# Patient Record
Sex: Male | Born: 1985 | Race: White | Hispanic: No | Marital: Single | Smoking: Never smoker
Health system: Southern US, Community
[De-identification: ages and names within clinical notes are randomized; demographics above are authoritative.]

## PROBLEM LIST (undated history)

## (undated) HISTORY — PX: NASAL FRACTURE SURGERY: SHX718

---
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
## 2014-02-14 ENCOUNTER — Emergency Department (INDEPENDENT_AMBULATORY_CARE_PROVIDER_SITE_OTHER)
Admission: EM | Admit: 2014-02-14 | Discharge: 2014-02-14 | Disposition: A | Discharge status: Home / Self Care | Payer: PRIVATE HEALTH INSURANCE | Source: Home / Self Care | Attending: Emergency Medicine | Admitting: Emergency Medicine

## 2014-02-14 ENCOUNTER — Encounter: Payer: Self-pay | Admitting: *Deleted

## 2014-02-14 DIAGNOSIS — K529 Noninfective gastroenteritis and colitis, unspecified: Secondary | ICD-10-CM

## 2014-02-14 DIAGNOSIS — J029 Acute pharyngitis, unspecified: Secondary | ICD-10-CM

## 2014-02-14 DIAGNOSIS — A09 Infectious gastroenteritis and colitis, unspecified: Secondary | ICD-10-CM

## 2014-02-14 LAB — POCT RAPID STREP A (OFFICE): Rapid Strep A Screen: NEGATIVE

## 2014-02-14 MED ORDER — ONDANSETRON HCL 4 MG PO TABS: 4.0000 mg | ORAL_TABLET | Freq: Four times a day (QID) | ORAL | Status: DC

## 2014-02-14 MED ORDER — CIPROFLOXACIN HCL 500 MG PO TABS: 500.0000 mg | ORAL_TABLET | Freq: Two times a day (BID) | ORAL | Status: DC

## 2014-02-14 MED ORDER — ONDANSETRON 4 MG PO TBDP
4.0000 mg | ORAL_TABLET | Freq: Once | ORAL | Status: AC
  Administered 2014-02-14: 4 mg via ORAL

## 2014-02-14 NOTE — ED Provider Notes (Signed)
CSN: [id number]     Arrival date & time 02/14/14  1001 History   First MD Initiated Contact with Patient 02/14/14 1003     Chief Complaint  Patient presents with  . Emesis  . Sore Throat    Patient is a 28 y.o. male presenting with vomiting.  Emesis Associated symptoms: diarrhea and sore throat   Acute onset this morning of nausea and vomited 4 without any blood. With nausea and mild diffuse abdominal pain without radiation. Had 2 loose watery stools without blood or mucus. His throat feels sore, he feels from irritation from the vomiting.He feels he cannot talk because of the irritation from the vomiting. He communicates his history by typing text responses into his cell phone, then showing the text to nurse and to myself.  No chest pain or shortness of breath. Felt some mild fever and chills. No syncope or focal neurologic symptoms. No rash. No history of tick bite. Has not tried any medication for this. No history of chronic GI problems. He states that he thinks this was triggered by [place] [employer](chorizo), which he bought at a meat market 5 days ago, and self prepared for dinner last night.     History reviewed. No pertinent past medical history. Past Surgical History  Procedure Laterality Date  . Nasal fracture surgery     Family History  Problem Relation Age of Onset  . Hypertension Mother   . Hyperlipidemia Mother    History  Substance Use Topics  . Smoking status: Never Smoker   . Smokeless tobacco: Not on file  . Alcohol Use: No    Review of Systems  HENT: Positive for sore throat.   Cardiovascular: Negative for chest pain, palpitations and leg swelling.  Gastrointestinal: Positive for vomiting and diarrhea.  Genitourinary: Negative.   Neurological: Negative.   All other systems reviewed and are negative.   Allergies  Review of patient's allergies indicates no known allergies.  Home Medications   Prior to Admission medications   Medication Sig Start Date  End Date Taking? Authorizing Provider  ciprofloxacin (CIPRO) 500 MG tablet Take 1 tablet (500 mg total) by mouth 2 (two) times daily. X 5 days 02/14/14   Lajean Manesavid Massey, MD  ondansetron (ZOFRAN) 4 MG tablet Take 1 tablet (4 mg total) by mouth every 6 (six) hours. As needed for nausea or vomiting 02/14/14   Lajean Manesavid Massey, MD   BP 125/84 mmHg  Pulse 68  Temp(Src) 98.5 F (36.9 C) (Oral)  Resp 18  Ht 6\' 6"  (1.981 m)  Wt 313 lb (141.976 kg)  BMI 36.18 kg/m2  SpO2 97% Physical Exam  Constitutional: He is oriented to person, place, and time. He appears well-developed and well-nourished. No distress.  HENT:  Right Ear: External ear normal.  Left Ear: External ear normal.  Nose: Nose normal.  Mouth/Throat: Oropharynx is clear and moist. No oropharyngeal exudate.  Minimal injection posterior pharynx. Rapid strep test today negative  Eyes: Conjunctivae are normal. Right eye exhibits no discharge. Left eye exhibits no discharge. No scleral icterus.  Neck: Normal range of motion. Neck supple.  Cardiovascular: Normal rate, regular rhythm and normal heart sounds.   Pulmonary/Chest: Breath sounds normal.  Abdominal: Soft. He exhibits no abdominal bruit and no pulsatile midline mass. Bowel sounds are increased. There is no hepatosplenomegaly. There is tenderness (minimal, diffuse). There is no rigidity, no rebound, no guarding, no CVA tenderness, no tenderness at McBurney's point and negative Murphy's sign.  Musculoskeletal: Normal range of motion.  Neurological: He is alert and oriented to person, place, and time. No cranial nerve deficit.  Skin: Skin is warm and dry. No rash noted.  Psychiatric: He has a normal mood and affect.  Nursing note and vitals reviewed.   ED Course  Procedures (including critical care time) Labs Review Labs Reviewed  POCT RAPID STREP A (OFFICE)    Imaging Review No results found.  He declined any other testing. He specifically declined CBC, CMP, or stool  cultures/O&P MDM   1. Gastroenteritis, infectious, presumed    Treatment options discussed, as well as risks, benefits, alternatives. He declined IM antinausea medication.  Patient voiced understanding and agreement with the following plans: Zofran 4 mg SL stat. He was observed and nausea improved somewhat. No vomiting witnessed here in urgent care   New Prescriptions   CIPROFLOXACIN (CIPRO) 500 MG TABLET    Take 1 tablet (500 mg total) by mouth 2 (two) times daily. X 5 days   ONDANSETRON (ZOFRAN) 4 MG TABLET    Take 1 tablet (4 mg total) by mouth every 6 (six) hours. As needed for nausea or vomiting  See detailed Instructions in AVS, which were given to patient. Verbal instructions also given. Risks, benefits, and alternatives of treatment options discussed. Questions invited and answered. Patient voiced understanding and agreement with plans. Follow-up with your primary care doctor in 2 days if not improving, or sooner if symptoms become worse. Precautions discussed. Red flags discussed. Questions invited and answered. Patient voiced understanding and agreement. Note written to excuse from work today and tomorrow.   Lajean Manesavid Massey, MD 02/14/14 [phone]

## 2014-02-14 NOTE — Discharge Instructions (Signed)
Today, we gave you Zofran by mouth, which is an anti-nausea medication. Prescriptions given for Cipro which is an antibiotic and also for more Zofran if needed for nausea.     Gastroenteritis  gastroenteritis is also known as stomach flu. This condition affects the stomach and intestinal tract. It can cause sudden diarrhea and vomiting. The illness typically lasts 3 to 8 days.  CAUSES  Many different viruses and bacteria can cause gastroenteritis, such as Salmonella, Escherichia coli, rotavirus or noroviruses. You can catch one of these viruses by consuming contaminated food or water. You may also catch a virus by sharing utensils or other personal items with an infected person or by touching a contaminated surface. SYMPTOMS  The most common symptoms are diarrhea and vomiting. These problems can cause a severe loss of body fluids (dehydration) and a body salt (electrolyte) imbalance. Other symptoms may include:  Fever.  Headache.  Fatigue.  Abdominal pain. DIAGNOSIS  Your caregiver can usually diagnose gastroenteritis based on your symptoms and a physical exam. A stool sample may also be taken to test for the presence of viruses or other infections. TREATMENT  This illness typically goes away on its own. Treatments are aimed at rehydration. The most serious cases of viral gastroenteritis involve vomiting so severely that you are not able to keep fluids down. In these cases, fluids must be given through an intravenous line (IV). HOME CARE INSTRUCTIONS   Drink enough fluids to keep your urine clear or pale yellow. Drink small amounts of fluids frequently and increase the amounts as tolerated.  Ask your caregiver for specific rehydration instructions.  Avoid:  Foods high in sugar.  Alcohol.  Carbonated drinks.  Tobacco.  Juice.  Caffeine drinks.  Extremely hot or cold fluids.  Fatty, greasy foods.  Too much intake of anything at one time.  Dairy products until 24 to 48  hours after diarrhea stops.  You may consume probiotics. Probiotics are active cultures of beneficial bacteria. They may lessen the amount and number of diarrheal stools in adults. Probiotics can be found in yogurt with active cultures and in supplements.  Wash your hands well to avoid spreading the virus.  Only take over-the-counter or prescription medicines for pain, discomfort, or fever as directed by your caregiver. Do not give aspirin to children. Antidiarrheal medicines are not recommended.  Ask your caregiver if you should continue to take your regular prescribed and over-the-counter medicines.  Keep all follow-up appointments as directed by your caregiver. SEEK IMMEDIATE MEDICAL CARE IF:   You are unable to keep fluids down.  You do not urinate at least once every 6 to 8 hours.  You develop shortness of breath.  You notice blood in your stool or vomit. This may look like coffee grounds.  You have abdominal pain that increases or is concentrated in one small area (localized).  You have persistent vomiting or diarrhea.  You have a fever.  The patient is a child younger than 3 months, and he or she has a fever.  The patient is a child older than 3 months, and he or she has a fever and persistent symptoms.  The patient is a child older than 3 months, and he or she has a fever and symptoms suddenly get worse.  The patient is a baby, and he or she has no tears when crying. MAKE SURE YOU:   Understand these instructions.  Will watch your condition.  Will get help right away if you are not doing well  or get worse. Document Released: 03/30/2005 Document Revised: 06/22/2011 Document Reviewed: 01/14/2011 [facility] Patient Information 2015 [place], [place]. This information is not intended to replace advice given to you by your health care provider. Make sure you discuss any questions you have with your health care provider. Salmonella Gastroenteritis Salmonella gastroenteritis  occurs when certain bacteria infect the intestines. People usually begin to feel ill within 72 hours after the infection occurs. The illness can last from 2 days to 2 weeks. Elderly and immunocompromised people are at the greatest risk of this infection. Most people recover completely. However, salmonella bacteria can spread from the intestines to the blood and other parts of the body. In rare cases, a person may develop reactive arthritis with pain in the joints, irritation of the eyes, and painful urination. CAUSES  Salmonella gastroenteritis usually occurs after eating food or drinking liquids that are contaminated with salmonella bacteria. Common causes of this contamination include:  Poor personal hygiene.  Poor kitchen hygiene.  Drinking polluted, standing water.  Contact with carriers of the bacteria. Reptiles are strongly associated with the bacteria, but other animals may carry the bacteria as well. SIGNS AND SYMPTOMS   Nausea.  Vomiting.  Abdominal pain or cramps.  Diarrhea, which may be bloody.  Fever.  Headache. DIAGNOSIS  Your health care provider will take your medical history and perform a physical exam. A blood or stool sample may also be taken and tested for the presence of salmonella bacteria. TREATMENT  Often, no treatment is needed. However, you will need to drink plenty of fluids to prevent dehydration. In severe cases, antibiotic medicines may be given to help shorten the illness. HOME CARE INSTRUCTIONS  Drink enough fluids to keep your urine clear or pale yellow. Until your diarrhea, nausea, or vomiting is under control, you should only drink clear liquids. Clear liquids are anything you can see through, such as water, broth, or non-caffeinated tea. Avoid:  Milk.  Fruit juice.  Alcohol.  Extremely hot or cold fluids.  If you do not have an appetite, do not force yourself to eat. However, you must continue to drink fluids.  If you have an appetite, eat  a normal diet unless your health care provider tells you differently.  Eat a variety of complex carbohydrates (rice, wheat, potatoes, bread), lean meats, yogurt, fruits, and vegetables.  Avoid high-fat foods because they are more difficult to digest.  If you are dehydrated, ask your health care provider for specific rehydration instructions. Signs of dehydration may include:  Severe thirst.  Dry lips and mouth.  Dizziness.  Dark urine.  Decreasing urine frequency and amount.  Confusion.  Rapid breathing or pulse.  If you were prescribed an antibiotic medicine, finish it all even if you start to feel better.  Take medicines only as directed by your health care provider. Antidiarrheal medicines are not recommended.  Keep all follow-up visits as directed by your health care provider. PREVENTION  To prevent future salmonella infections:  Handle meat, eggs, seafood, and poultry properly.  Wash your hands and counters thoroughly after handling or preparing meat, eggs, seafood, and poultry.  Always cook meat, eggs, seafood, and poultry thoroughly.  Wash your hands thoroughly after handling animals. SEEK IMMEDIATE MEDICAL CARE IF:   You are unable to keep fluids down.  You have persistent vomiting or diarrhea.  You have abdominal pain that increases or is concentrated in one small area (localized).  Your diarrhea contains increased blood or mucus.  You feel very weak, dizzy,  thirsty, or you faint.  You lose a significant amount of weight. Your health care provider can tell you how much weight loss should concern you.  You have a fever. MAKE SURE YOU:   Understand these instructions.  Will watch your condition.  Will get help right away if you are not doing well or get worse. Make sure you discuss any questions you have with your health care provider.

## 2014-02-14 NOTE — ED Notes (Signed)
Pt c/o vomiting x 4 and sore throat x this morning. He wrote down that he cannot talk because it hurts.

## 2014-02-16 ENCOUNTER — Emergency Department (INDEPENDENT_AMBULATORY_CARE_PROVIDER_SITE_OTHER)
Admission: EM | Admit: 2014-02-16 | Discharge: 2014-02-16 | Disposition: A | Discharge status: Home / Self Care | Payer: PRIVATE HEALTH INSURANCE | Source: Home / Self Care | Attending: Family Medicine | Admitting: Family Medicine

## 2014-02-16 ENCOUNTER — Encounter: Payer: Self-pay | Admitting: *Deleted

## 2014-02-16 DIAGNOSIS — R1110 Vomiting, unspecified: Secondary | ICD-10-CM

## 2014-02-16 DIAGNOSIS — R197 Diarrhea, unspecified: Secondary | ICD-10-CM

## 2014-02-16 LAB — POCT CBC W AUTO DIFF (K'VILLE URGENT CARE)

## 2014-02-16 MED ORDER — OMEPRAZOLE 40 MG PO CPDR: 40.0000 mg | DELAYED_RELEASE_CAPSULE | Freq: Every day | ORAL | Status: DC

## 2014-02-16 MED ORDER — PROMETHAZINE HCL 25 MG PO TABS: 25.0000 mg | ORAL_TABLET | Freq: Four times a day (QID) | ORAL | Status: DC | PRN

## 2014-02-16 NOTE — ED Notes (Signed)
Pt c/o that he still has some vomiting and diarrhea since his visit on 02/14/14. He reports a small amt of bright red bright in his stool once. Denies fever.

## 2014-02-16 NOTE — ED Provider Notes (Signed)
George Miranda is a 28 y.o. male who presents to Urgent Care today for Vomiting and diarrhea. Patient was seen on November 4 for sore throat vomiting and diarrhea. He was given Zofran and Cipro. His symptoms have improved a bit better still persistent. He denies any significant abdominal pain. No fevers or chills. His sore throat is improved. No chest pains palpitations or shortness of breath.   History reviewed. No pertinent past medical history. Past Surgical History  Procedure Laterality Date  . Nasal fracture surgery     History  Substance Use Topics  . Smoking status: Never Smoker   . Smokeless tobacco: Not on file  . Alcohol Use: No   ROS as above Medications: No current facility-administered medications for this encounter.   Current Outpatient Prescriptions  Medication Sig Dispense Refill  . ciprofloxacin (CIPRO) 500 MG tablet Take 1 tablet (500 mg total) by mouth 2 (two) times daily. X 5 days 10 tablet 0  . ondansetron (ZOFRAN) 4 MG tablet Take 1 tablet (4 mg total) by mouth every 6 (six) hours. As needed for nausea or vomiting 8 tablet 0   No Known Allergies   Exam:  BP 118/72 mmHg  Pulse 70  Temp(Src) 98.1 F (36.7 C) (Oral)  Resp 18  Ht 6\' 6"  (1.981 m)  Wt 317 lb (143.79 kg)  BMI 36.64 kg/m2  SpO2 99% Gen: Well NAD HEENT: EOMI,  MMM Lungs: Normal work of breathing. CTABL Heart: RRR no MRG Abd: NABS, Soft. Nondistended, mildly tender upper left quadrant. No rebound or guarding. No CV angle tenderness to percussion Exts: Brisk capillary refill, warm and well perfused.    Point-of-care CBC: White blood cell count 5.4, hemoglobin 14.9, platelets 252   No results found for this or any previous visit (from the past 24 hour(s)). No results found.  Assessment and Plan: 28 y.o. male with abdominal pain. Suspect gastritis. Stool culture, C. Difficile, ova parasites are pending as well as blood serology for H. Pylori pending. Try treatment with Phenergan and  omeprazole. Return as needed.  Discussed warning signs or symptoms. Please see discharge instructions. Patient expresses understanding.     Rodolph BongEvan S Odis Turck, MD 02/16/14 [phone]

## 2014-02-16 NOTE — Discharge Instructions (Signed)
Thank you for coming in today. Take Phenergan as needed. This medicine will make you sleepy so do not take while driving. Take omeprazole daily. Bring your stool sample back. If your belly pain worsens, or you have high fever, bad vomiting, blood in your stool or black tarry stool go to the Emergency Room.    Bowel Movement Culture  A bowel movement culture checks your poop (bowel movement) for illnesses. The doctor will give you all the supplies you need. For each sample you collect, you may get:   A small container. You may be given different colored containers. Follow the instructions for each container you are given.  Gloves that can be thrown away.  A plastic bag. Ask the doctor if you have questions. BEFORE COLLECTING THE SAMPLE  Cover the toilet bowl with plastic wrap or a plastic bag. Tape the wrap or bag to the bowl of the toilet (not the seat). Do not stretch the plastic tight across the bowl. Leave room for the poop to fall. You can also use a plastic carton to catch your poop. Wash and dry any cartons. Keep these in the bathroom.  Try not to mix pee (urine) with your poop. Pee before pooping.  Do not mix toilet paper or water with your sample.  Women on their period should wait 3 days after their period has ended before collecting a sample. COLLECTING THE SAMPLE  Wash your hands.  Put on gloves.  Do not pour out the fluid that is in the tube. This fluid will preserve your sample.  Use the small shovel built into the top of the tube to put small scoops of your poop into the tube. Choose the parts of your poop which are bloody, slimy, or watery. Fill the tube up to the red line on the tube label. If your poop is hard, choose samples from each end and the middle.  Stir the poop in the tube with the small shovel. Close the top on the tube tightly. Shake the tube until the poop is well mixed.  On the label, write:  The date and time you collected the sample.  Your  initials.  Put the tube in the plastic bag that was given to you.  If your doctor wants you to collect more than 1 sample, collect them at different times you poop. Follow these instructions each time you collect a sample. AFTER COLLECTING THE SAMPLE  Store your sample(s) using the directions on each test container you were given. Some samples should be kept at room temperature. Others need to be refrigerated right away.  You may need to return the sample within 24 hours. Check the directions or call the clinic if you are not sure.  Flush the rest of your poop down the toilet (but not the plastic wrap). Throw away the gloves. Throw away the carton, if you used one.  Wash your hands. Document Released: 05/02/2010 Document Revised: 06/22/2011 Document Reviewed: 05/02/2010 [facility] Patient Information 2015 [place]. This information is not intended to replace advice given to you by your health care provider. Make sure you discuss any questions you have with your health care provider.

## 2014-02-18 ENCOUNTER — Telehealth: Payer: Self-pay

## 2014-02-19 LAB — H. PYLORI ANTIBODY, IGG: H Pylori IgG: 0.47 {ISR}

## 2014-08-01 ENCOUNTER — Encounter (HOSPITAL_BASED_OUTPATIENT_CLINIC_OR_DEPARTMENT_OTHER): Payer: Self-pay

## 2014-08-01 ENCOUNTER — Emergency Department (HOSPITAL_BASED_OUTPATIENT_CLINIC_OR_DEPARTMENT_OTHER)
Admission: EM | Admit: 2014-08-01 | Discharge: 2014-08-01 | Disposition: A | Discharge status: Home / Self Care | Payer: Self-pay | Attending: Emergency Medicine | Admitting: Emergency Medicine

## 2014-08-01 DIAGNOSIS — Z79899 Other long term (current) drug therapy: Secondary | ICD-10-CM | POA: Insufficient documentation

## 2014-08-01 DIAGNOSIS — R112 Nausea with vomiting, unspecified: Secondary | ICD-10-CM | POA: Insufficient documentation

## 2014-08-01 DIAGNOSIS — R197 Diarrhea, unspecified: Secondary | ICD-10-CM | POA: Insufficient documentation

## 2014-08-01 DIAGNOSIS — R1030 Lower abdominal pain, unspecified: Secondary | ICD-10-CM | POA: Insufficient documentation

## 2014-08-01 DIAGNOSIS — R1110 Vomiting, unspecified: Secondary | ICD-10-CM

## 2014-08-01 DIAGNOSIS — R109 Unspecified abdominal pain: Secondary | ICD-10-CM

## 2014-08-01 LAB — COMPREHENSIVE METABOLIC PANEL
ALBUMIN: 3.7 g/dL (ref 3.5–5.2)
ALT: 52 U/L (ref 0–53)
AST: 29 U/L (ref 0–37)
Alkaline Phosphatase: 70 U/L (ref 39–117)
Anion gap: 8 (ref 5–15)
BUN: 16 mg/dL (ref 6–23)
CALCIUM: 8.6 mg/dL (ref 8.4–10.5)
CO2: 26 mmol/L (ref 19–32)
CREATININE: 1.05 mg/dL (ref 0.50–1.35)
Chloride: 106 mmol/L (ref 96–112)
GFR calc Af Amer: >90 mL/min (ref >90)
GFR calc non Af Amer: >90 mL/min (ref >90)
Glucose, Bld: 108 mg/dL — ABNORMAL HIGH (ref 70–99)
Potassium: 4.1 mmol/L (ref 3.5–5.1)
Sodium: 140 mmol/L (ref 135–145)
Total Bilirubin: 0.6 mg/dL (ref 0.3–1.2)
Total Protein: 7 g/dL (ref 6.0–8.3)

## 2014-08-01 LAB — LIPASE, BLOOD: Lipase: 42 U/L (ref 11–59)

## 2014-08-01 LAB — CBC WITH DIFFERENTIAL/PLATELET
Basophils Absolute: 0 10*3/uL (ref 0.0–0.1)
Basophils Relative: 1 % (ref 0–1)
EOS ABS: 0.1 10*3/uL (ref 0.0–0.7)
Eosinophils Relative: 2 % (ref 0–5)
HCT: 43.3 % (ref 39.0–52.0)
Hemoglobin: 14.8 g/dL (ref 13.0–17.0)
LYMPHS ABS: 2.3 10*3/uL (ref 0.7–4.0)
Lymphocytes Relative: 29 % (ref 12–46)
MCH: 29.4 pg (ref 26.0–34.0)
MCHC: 34.2 g/dL (ref 30.0–36.0)
MCV: 85.9 fL (ref 78.0–100.0)
MONO ABS: 0.9 10*3/uL (ref 0.1–1.0)
MONOS PCT: 12 % (ref 3–12)
NEUTROS PCT: 56 % (ref 43–77)
Neutro Abs: 4.6 10*3/uL (ref 1.7–7.7)
Platelets: 210 10*3/uL (ref 150–400)
RBC: 5.04 MIL/uL (ref 4.22–5.81)
RDW: 13.6 % (ref 11.5–15.5)
WBC: 8 10*3/uL (ref 4.0–10.5)

## 2014-08-01 LAB — URINALYSIS, ROUTINE W REFLEX MICROSCOPIC
BILIRUBIN URINE: NEGATIVE
GLUCOSE, UA: NEGATIVE mg/dL
HGB URINE DIPSTICK: NEGATIVE
KETONES UR: NEGATIVE mg/dL
Leukocytes, UA: NEGATIVE
Nitrite: NEGATIVE
Protein, ur: NEGATIVE mg/dL
Specific Gravity, Urine: 1.027 (ref 1.005–1.030)
Urobilinogen, UA: 1 mg/dL (ref 0.0–1.0)
pH: 7 (ref 5.0–8.0)

## 2014-08-01 MED ORDER — HYOSCYAMINE SULFATE 0.125 MG SL SUBL
0.1250 mg | SUBLINGUAL_TABLET | Freq: Once | SUBLINGUAL | Status: AC
  Administered 2014-08-01: 0.125 mg via SUBLINGUAL
  Filled 2014-08-01: qty 1

## 2014-08-01 MED ORDER — ONDANSETRON HCL 4 MG/2ML IJ SOLN
4.0000 mg | Freq: Once | INTRAMUSCULAR | Status: AC
  Administered 2014-08-01: 4 mg via INTRAVENOUS
  Filled 2014-08-01: qty 2

## 2014-08-01 MED ORDER — ONDANSETRON HCL 4 MG PO TABS: 4.0000 mg | ORAL_TABLET | Freq: Four times a day (QID) | ORAL | Status: DC

## 2014-08-01 MED ORDER — SODIUM CHLORIDE 0.9 % IV BOLUS (SEPSIS)
1000.0000 mL | Freq: Once | INTRAVENOUS | Status: AC
  Administered 2014-08-01: 1000 mL via INTRAVENOUS

## 2014-08-01 NOTE — Discharge Instructions (Signed)
Food Poisoning °Food poisoning is an illness caused by something you ate or drank. There are over 250 known causes of food poisoning. However, many other causes are unknown. You can be treated even if the exact cause of your food poisoning is not known. In most cases, food poisoning is mild and lasts 1 to 2 days. However, some cases can be serious, especially for people with low immune systems, the elderly, children and infants, and pregnant women. °CAUSES  °Poor personal hygiene, improper cleaning of storage and preparation areas, and unclean utensils can cause infection or tainting (contamination) of foods. The causes of food poisoning are numerous. Infectious agents, such as viruses, bacteria, or parasites, can cause harm by infecting the intestine and disrupting the absorption of nutrients and water. This can cause diarrhea and lead to dehydration. Viruses are responsible for most of the food poisonings in which an agent is found. Parasites are less likely to cause food poisoning. Toxic agents, such as poisonous mushrooms, marine algae, and pesticides can also cause food poisoning. °· Viral causes of food poisoning include: °¨ Norovirus. °¨ Rotavirus. °¨ Hepatitis A. °· Bacterial causes of food poisoning include: °¨ Salmonellae. °¨ Campylobacter. °¨ Bacillus cereus. °¨ Escherichia coli (E. coli). °¨ Shigella. °¨ Listeria monocytogenes. °¨ Clostridium botulinum (botulism). °¨ Vibrio cholerae. °· Parasites that can cause food poisoning include: °¨ Giardia. °¨ Cryptosporidium. °¨ Toxoplasma. °SYMPTOMS °Symptoms may appear several hours or longer after consuming the contaminated food or drink. Symptoms may include: °· Nausea. °· Vomiting. °· Cramping. °· Diarrhea. °· Fever and chills. °· Muscle aches. °DIAGNOSIS °Your health care provider may be able to diagnose food poisoning from a list of what you have recently eaten and results from lab tests. Diagnostic tests may include an exam of the feces. °TREATMENT °In  most cases, treatment focuses on helping to relieve your symptoms and staying well hydrated. Antibiotic medicines are rarely needed. In severe cases, hospitalization may be required. °HOME CARE INSTRUCTIONS  °· Drink enough water and fluids to keep your urine clear or pale yellow. Drink small amounts of fluids frequently and increase as tolerated. °· Ask your health care provider for specific rehydration instructions. °· Avoid: °¨ Foods high in sugar. °¨ Alcohol. °¨ Carbonated drinks. °¨ Tobacco. °¨ Juice. °¨ Caffeine drinks. °¨ Extremely hot or cold fluids. °¨ Fatty, greasy foods. °¨ Too much intake of anything at one time. °¨ Dairy products until 24 to 48 hours after diarrhea stops. °· You may consume probiotics. Probiotics are active cultures of beneficial bacteria. They may lessen the amount and number of diarrheal stools in adults. Probiotics can be found in yogurt with active cultures and in supplements. °· Wash your hands well to avoid spreading the bacteria. °· Take medicines only as directed by your health care provider. Do not give your child aspirin because of the association with Reye's syndrome. °· Ask your health care provider if you should continue to take your regular prescribed and over-the-counter medicines. °PREVENTION  °· Wash your hands, food preparation surfaces, and utensils thoroughly before and after handling raw foods. °· Keep refrigerated foods below 40°F (5°C). °· Serve hot foods immediately or keep them heated above 140°F (60°C). °· Divide large volumes of food into small portions for rapid cooling in the refrigerator. Hot, bulky foods in the refrigerator can raise the temperature of other foods that have already cooled. °· Follow approved canning procedures. °· Heat canned foods thoroughly before tasting. °· When in doubt, throw it out. °· Infants, the elderly, women   who are pregnant, and people with compromised immune systems are especially susceptible to food poisoning. These people  should never consume unpasteurized cheese, unpasteurized cider, raw fish, raw seafood, or raw meat-type products. °SEEK IMMEDIATE MEDICAL CARE IF:  °· You have difficulty breathing, swallowing, talking, or moving. °· You develop blurred vision. °· You are unable to keep fluids down. °· You faint or nearly faint. °· Your eyes turn yellow. °· Vomiting or diarrhea develops or becomes persistent. °· Abdominal pain develops, increases, or localizes in one small area. °· You have a fever. °· The diarrhea becomes excessive or contains blood or mucus. °· You develop excessive weakness, dizziness, or extreme thirst. °· You have no urine for 8 hours. °MAKE SURE YOU:  °· Understand these instructions. °· Will watch your condition. °· Will get help right away if you are not doing well or get worse. °Document Released: 12/27/2003 Document Revised: 08/14/2013 Document Reviewed: 08/14/2010 °[employer]® Patient Information ©2015 [employer]. This information is not intended to replace advice given to you by your health care provider. Make sure you discuss any questions you have with your health care provider. ° °

## 2014-08-01 NOTE — ED Notes (Signed)
Pt reports n/v/d.  Pt reports thinks he might of ate something bad.  Reports eating tacos off [employer].  Reports emesis x 3 and diarrhea x 2 with blood in stool.

## 2014-08-01 NOTE — ED Provider Notes (Signed)
CSN: [id number]     Arrival date & time 08/01/14  1544 History   First MD Initiated Contact with Patient 08/01/14 1655     Chief Complaint  Patient presents with  . Nausea  . Emesis     Patient is a 29 y.o. male presenting with vomiting. The history is provided by the patient. No language interpreter was used.  Emesis  Mr. Oakely resistance for evaluation of vomiting and diarrhea. He had tacos off a truck and shortly after that he developed vomiting and diarrhea. He had approximately 3 episodes of emesis and 2 episodes of diarrhea. The last episode of diarrhea did have blood mixed in. He has some lower abdominal cramping. He denies any fevers, dysuria, chest pain. He has no medical problems. Symptoms are moderate, constant, improving.    History reviewed. No pertinent past medical history. Past Surgical History  Procedure Laterality Date  . Nasal fracture surgery     Family History  Problem Relation Age of Onset  . Hypertension Mother   . Hyperlipidemia Mother    History  Substance Use Topics  . Smoking status: Never Smoker   . Smokeless tobacco: Not on file  . Alcohol Use: No    Review of Systems  Gastrointestinal: Positive for vomiting.  All other systems reviewed and are negative.     Allergies  Review of patient's allergies indicates no known allergies.  Home Medications   Prior to Admission medications   Medication Sig Start Date End Date Taking? Authorizing Provider  ciprofloxacin (CIPRO) 500 MG tablet Take 1 tablet (500 mg total) by mouth 2 (two) times daily. X 5 days 02/14/14   Lajean Manes, MD  omeprazole (PRILOSEC) 40 MG capsule Take 1 capsule (40 mg total) by mouth daily. 02/16/14   Rodolph Bong, MD  ondansetron (ZOFRAN) 4 MG tablet Take 1 tablet (4 mg total) by mouth every 6 (six) hours. As needed for nausea or vomiting 02/14/14   Lajean Manes, MD  promethazine (PHENERGAN) 25 MG tablet Take 1 tablet (25 mg total) by mouth every 6 (six) hours as needed for  nausea or vomiting. 02/16/14   Rodolph Bong, MD   BP 122/78 mmHg  Pulse 86  Temp(Src) 98.3 F (36.8 C) (Oral)  Resp 16  Ht  (1.981 m)  Wt 320 lb (145.151 kg)  BMI 36.99 kg/m2  SpO2 95% Physical Exam  Constitutional: He is oriented to person, place, and time. He appears well-developed and well-nourished.  HENT:  Head: Normocephalic and atraumatic.  Cardiovascular: Normal rate and regular rhythm.   No murmur heard. Pulmonary/Chest: Effort normal and breath sounds normal. No respiratory distress.  Abdominal: Soft. There is no rebound and no guarding.  Mild lower abdominal tenderness without guarding/rebound  Musculoskeletal: He exhibits no edema or tenderness.  Neurological: He is alert and oriented to person, place, and time.  Skin: Skin is warm and dry.  Psychiatric: He has a normal mood and affect. His behavior is normal.  Nursing note and vitals reviewed.   ED Course  Procedures (including critical care time) Labs Review Labs Reviewed  URINALYSIS, ROUTINE W REFLEX MICROSCOPIC - Abnormal; Notable for the following:    APPearance CLOUDY (*)    All other components within normal limits  COMPREHENSIVE METABOLIC PANEL - Abnormal; Notable for the following:    Glucose, Bld 108 (*)    All other components within normal limits  CBC WITH DIFFERENTIAL/PLATELET  LIPASE, BLOOD    Imaging Review No results found.  EKG Interpretation None      MDM   Final diagnoses:  Abdominal pain, vomiting, and diarrhea    Patient here for evaluation of vomiting, diarrhea, abdominal pain. History and examination likely consistent with gastroenteritis versus food poisoning. Patient is improved on examination and able to tolerate oral fluids. Feel appendicitis is unlikely. Discussed close return precautions for repeat abdominal exam in the next 12-24 hours if he has recurrent or ongoing pain.    Tilden FossaElizabeth Jailyn Leeson, MD 08/02/14 [phone]

## 2014-08-02 ENCOUNTER — Encounter: Payer: Self-pay | Admitting: Emergency Medicine

## 2014-08-02 ENCOUNTER — Emergency Department (INDEPENDENT_AMBULATORY_CARE_PROVIDER_SITE_OTHER)
Admission: EM | Admit: 2014-08-02 | Discharge: 2014-08-02 | Disposition: A | Discharge status: Home / Self Care | Payer: PRIVATE HEALTH INSURANCE | Source: Home / Self Care | Attending: Emergency Medicine | Admitting: Emergency Medicine

## 2014-08-02 DIAGNOSIS — A09 Infectious gastroenteritis and colitis, unspecified: Secondary | ICD-10-CM | POA: Diagnosis not present

## 2014-08-02 MED ORDER — CIPROFLOXACIN HCL 500 MG PO TABS: 500.0000 mg | ORAL_TABLET | Freq: Two times a day (BID) | ORAL | Status: DC

## 2014-08-02 NOTE — ED Notes (Signed)
Pt called requesting an extended work note. Note will not be provided. Advised him to be evaluated again if needed.

## 2014-08-02 NOTE — ED Provider Notes (Signed)
CSN: [id number]     Arrival date & time 08/02/14  1323 History   First MD Initiated Contact with Patient 08/02/14 1345     Chief Complaint  Patient presents with  . Follow-up   nausea vomiting diarrhea  HPI Patient was seen yesterday at [facility] ED for nausea vomiting diarrhea. I reviewed those notes. Yesterday, he ate tacos which he bought off a food truck and shortly afterward developed vomiting and diarrhea and episodes of crampy abdominal pain. Yesterday at ED, CBC was normal, nonfasting CMP normal, urinalysis negative for blood and nitrate leukocyte.-Evaluation at ED was that he has no evidence of appendicitis or acute abdomen. He was treated with Zofran which has helped the nausea and abdominal discomfort. He is now tolerating small amount of liquids and bland solids without any vomiting in 24 hours. However, he still feels fatigued, and he continues with semisoft stools, every 2 hours, with tinges of bright red blood at times. Today is Thursday, and he feels he cannot return to work being fatigued and frequent semisoft stools, and requests a note to return to work Monday 4/25. No mucus. No melena. Abdominal discomfort is mild and nonspecific. No cardiorespiratory symptoms. Denies fever or chills or lightheadedness. Denies any recent foreign travel. No rash. No history of tick bite.  Remainder of Review of Systems negative for acute change except as noted in the HPI.   History reviewed. No pertinent past medical history. Past Surgical History  Procedure Laterality Date  . Nasal fracture surgery     Family History  Problem Relation Age of Onset  . Hypertension Mother   . Hyperlipidemia Mother    History  Substance Use Topics  . Smoking status: Never Smoker   . Smokeless tobacco: Not on file  . Alcohol Use: No    Review of Systems  Allergies  Review of patient's allergies indicates no known allergies.  Home Medications   Prior to Admission medications    Medication Sig Start Date End Date Taking? Authorizing Provider  ciprofloxacin (CIPRO) 500 MG tablet Take 1 tablet (500 mg total) by mouth 2 (two) times daily. For 5 days 08/02/14   Lajean Manesavid Massey, MD  ondansetron (ZOFRAN) 4 MG tablet Take 1 tablet (4 mg total) by mouth every 6 (six) hours. 08/01/14   Tilden FossaElizabeth Rees, MD   BP 136/93 mmHg  Pulse 110  Temp(Src) 98 F (36.7 C) (Oral)  Ht 6\' 6"  (1.981 m)  Wt 320 lb (145.151 kg)  BMI 36.99 kg/m2  SpO2 95% Physical Exam  Constitutional: He is oriented to person, place, and time. He appears well-developed and well-nourished. No distress.  HENT:  Right Ear: External ear normal.  Left Ear: External ear normal.  Nose: Nose normal.  Mouth/Throat: Oropharynx is clear and moist. No oropharyngeal exudate.  Eyes: Conjunctivae are normal. Right eye exhibits no discharge. Left eye exhibits no discharge. No scleral icterus.  Neck: Normal range of motion. Neck supple.  Cardiovascular: Normal rate, regular rhythm and normal heart sounds.   Pulmonary/Chest: Breath sounds normal.  Abdominal: Soft. He exhibits no distension, no abdominal bruit and no pulsatile midline mass. Bowel sounds are increased. There is no hepatosplenomegaly. There is tenderness (Minimal, diffuse, nonreproducible). There is no rigidity, no rebound, no guarding, no CVA tenderness, no tenderness at McBurney's point and negative Murphy's sign.  Musculoskeletal: Normal range of motion.  Neurological: He is alert and oriented to person, place, and time. No cranial nerve deficit.  Skin: Skin is warm and dry. No  rash noted.  Psychiatric: He has a normal mood and affect.  Nursing note and vitals reviewed.  He declined any further exam or testing. ED Course  Procedures (including critical care time) Labs Review Labs Reviewed - No data to display  Imaging Review No results found.   MDM   1. Gastroenteritis/colitis, infectious    No evidence for acute abdomen. I agree that he has  gastroenteritis/colitis most likely from food poisoning or infectious cause. Treatment options discussed, as well as risks, benefits, alternatives. Patient voiced understanding and agreement with the following plans: Continue Zofran as needed for nausea. New Prescriptions   CIPROFLOXACIN (CIPRO) 500 MG TABLET    Take 1 tablet (500 mg total) by mouth 2 (two) times daily. For 5 days   Handout and verbal information on bland food. Push clear liquids. Avoid dairy for now. I wrote a note excusing him from work for/20 through 08/05/2014. May return to work Monday 4/25/ 2016. I urged him to establish with PCP follow-up with PCP if not improving within 3-4 days.--Also advised that if he continues with any GI problems, to see a gastroenterologist for consultation and management. Precautions discussed. Red flags discussed.--Emergency room if any red flag Questions invited and answered. Patient voiced understanding and agreement.     Lajean Manes, MD 08/02/14 [phone]

## 2014-08-02 NOTE — ED Notes (Signed)
Patient was seen in ED yesterday, given Zofran for vomiting, they refused to give him a note for more than 2 days. He still has blood in his stool, LBP, nausea is resolving.

## 2014-12-19 ENCOUNTER — Encounter (HOSPITAL_BASED_OUTPATIENT_CLINIC_OR_DEPARTMENT_OTHER): Payer: Self-pay

## 2014-12-19 ENCOUNTER — Emergency Department (HOSPITAL_BASED_OUTPATIENT_CLINIC_OR_DEPARTMENT_OTHER)
Admission: EM | Admit: 2014-12-19 | Discharge: 2014-12-19 | Disposition: A | Discharge status: Home / Self Care | Payer: PRIVATE HEALTH INSURANCE | Attending: Emergency Medicine | Admitting: Emergency Medicine

## 2014-12-19 DIAGNOSIS — R1110 Vomiting, unspecified: Secondary | ICD-10-CM | POA: Diagnosis present

## 2014-12-19 DIAGNOSIS — A084 Viral intestinal infection, unspecified: Secondary | ICD-10-CM | POA: Insufficient documentation

## 2014-12-19 DIAGNOSIS — K2970 Gastritis, unspecified, without bleeding: Secondary | ICD-10-CM

## 2014-12-19 MED ORDER — ONDANSETRON 4 MG PREPACK (~~LOC~~): 1.0000 | ORAL_TABLET | Freq: Three times a day (TID) | ORAL | Status: DC | PRN

## 2014-12-19 NOTE — Discharge Instructions (Signed)
Gastritis, Adult Gastritis is soreness and puffiness (inflammation) of the lining of the stomach. If you do not get help, gastritis can cause bleeding and sores (ulcers) in the stomach. HOME CARE   Only take medicine as told by your doctor.  If you were given antibiotic medicines, take them as told. Finish the medicines even if you start to feel better.  Drink enough fluids to keep your pee (urine) clear or pale yellow.  Avoid foods and drinks that make your problems worse. Foods you may want to avoid include:  Caffeine or alcohol.  Chocolate.  Mint.  Garlic and onions.  Spicy foods.  Citrus fruits, including oranges, lemons, or limes.  Food containing tomatoes, including sauce, chili, salsa, and pizza.  Fried and fatty foods.  Eat small meals throughout the day instead of large meals. GET HELP RIGHT AWAY IF:   You have black or dark red poop (stools).  You throw up (vomit) blood. It may look like coffee grounds.  You cannot keep fluids down.  Your belly (abdominal) pain gets worse.  You have a fever.  You do not feel better after 1 week.  You have any other questions or concerns. MAKE SURE YOU:   Understand these instructions.  Will watch your condition.  Will get help right away if you are not doing well or get worse. Document Released: 09/16/2007 Document Revised: 06/22/2011 Document Reviewed: 05/13/2011 [facility] Patient Information 2015 [place], [place]. This information is not intended to replace advice given to you by your health care provider. Make sure you discuss any questions you have with your health care provider.  Return if vomiting persists, if fevers, chills, abdominal pain occur. Stay hydrated. Avoid alcohol, spicy foods.

## 2014-12-19 NOTE — ED Notes (Signed)
Pt states he vomited x 2 at work-his boss sent him home and pt had to have RTW note-pt denies c/o at present

## 2014-12-19 NOTE — ED Provider Notes (Signed)
CSN: [id number]     Arrival date & time 12/19/14  1625 History   First MD Initiated Contact with Patient 12/19/14 1637     Chief Complaint  Patient presents with  . Emesis     (Consider location/radiation/quality/duration/timing/severity/associated sxs/prior Treatment) HPI Comments: George Miranda is a 29 y.o M no significant past medical history who presents to the emergency department today complaining of vomiting twice earlier today. Patient states that earlier while at work patient had 2 episodes of nonbloody and nonbilious vomiting. The patient was sent home by process who told him to go get a work note. He states that he feels much better now. No current nausea. Patient states that he has mild epigastric abdominal tenderness after vomiting. Denies fever, chills, diarrhea, chest pain, shortness of breath.   Patient is a 29 y.o. male presenting with vomiting.  Emesis Severity:  Moderate Timing:  Sporadic Number of daily episodes:  2 Quality:  Stomach contents Able to tolerate:  Liquids and solids Progression:  Resolved Chronicity:  New Recent urination:  Normal Context: not post-tussive and not self-induced   Worsened by:  Nothing tried Ineffective treatments:  None tried Associated symptoms: no arthralgias, no chills, no cough, no diarrhea, no fever, no myalgias, no sore throat and no URI     History reviewed. No pertinent past medical history. Past Surgical History  Procedure Laterality Date  . Nasal fracture surgery     Family History  Problem Relation Age of Onset  . Hypertension Mother   . Hyperlipidemia Mother    Social History  Substance Use Topics  . Smoking status: Never Smoker   . Smokeless tobacco: None  . Alcohol Use: No    Review of Systems  Constitutional: Negative for chills.  HENT: Negative for sore throat.   Gastrointestinal: Positive for vomiting. Negative for diarrhea.  Genitourinary: Negative for dysuria.  Musculoskeletal: Negative for myalgias  and arthralgias.  All other systems reviewed and are negative.     Allergies  Review of patient's allergies indicates no known allergies.  Home Medications   Prior to Admission medications   Medication Sig Start Date End Date Taking? Authorizing Provider  ondansetron (ZOFRAN) 4 mg TABS tablet Take 4 tablets by mouth every 8 (eight) hours as needed. 12/19/14   George Tripp Dowless, PA-C   BP 137/85 mmHg  Pulse 104  Temp(Src) 98.2 F (36.8 C) (Oral)  Resp 20  Ht 6\' 6"  (1.981 m)  Wt 310 lb (140.615 kg)  BMI 35.83 kg/m2  SpO2 97% Physical Exam  Constitutional: He is oriented to person, place, and time. He appears well-developed and well-nourished. No distress.  HENT:  Head: Normocephalic and atraumatic.  Mouth/Throat: Oropharynx is clear and moist. No oropharyngeal exudate.  Eyes: Conjunctivae and EOM are normal. Pupils are equal, round, and reactive to light. Right eye exhibits no discharge. Left eye exhibits no discharge. No scleral icterus.  Neck: Neck supple.  Cardiovascular: Normal rate, regular rhythm, normal heart sounds and intact distal pulses.  Exam reveals no gallop and no friction rub.   No murmur heard. Pulmonary/Chest: Effort normal and breath sounds normal. No respiratory distress. He has no wheezes. He has no rales. He exhibits no tenderness.  Abdominal: Soft. He exhibits no distension and no mass. There is tenderness. There is no rebound and no guarding.  No peritoneal signs.  Musculoskeletal: Normal range of motion. He exhibits no edema.  Lymphadenopathy:    He has no cervical adenopathy.  Neurological: He is alert and oriented  to person, place, and time.  Skin: Skin is warm and dry. No rash noted. He is not diaphoretic. No erythema. No pallor.  Psychiatric: He has a normal mood and affect. His behavior is normal.  Nursing note and vitals reviewed.   ED Course  Procedures (including critical care time) Labs Review Labs Reviewed - No data to  display  Imaging Review No results found. I have personally reviewed and evaluated these images and lab results as part of my medical decision-making.   EKG Interpretation None      MDM   Final diagnoses:  Viral gastritis    Patient seen for 2 episodes of vomiting earlier today. No peritoneal signs. Appendicitis unlikely. Afebrile. Symptoms likely due to a viral gastritis. We'll give Zofran outpatient. Return precautions outlined in patient discharge instructions.  Patient was discussed with and seen by George Miranda who agrees with the treatment plan.      George Kinsman Cruger, PA-C 12/19/14 2255  George Mo, MD 12/31/14 [phone]

## 2015-01-24 ENCOUNTER — Emergency Department (HOSPITAL_BASED_OUTPATIENT_CLINIC_OR_DEPARTMENT_OTHER)
Admission: EM | Admit: 2015-01-24 | Discharge: 2015-01-24 | Disposition: A | Discharge status: Home / Self Care | Payer: PRIVATE HEALTH INSURANCE | Attending: Emergency Medicine | Admitting: Emergency Medicine

## 2015-01-24 ENCOUNTER — Emergency Department (HOSPITAL_BASED_OUTPATIENT_CLINIC_OR_DEPARTMENT_OTHER): Payer: PRIVATE HEALTH INSURANCE

## 2015-01-24 ENCOUNTER — Encounter (HOSPITAL_BASED_OUTPATIENT_CLINIC_OR_DEPARTMENT_OTHER): Payer: Self-pay | Admitting: Adult Health

## 2015-01-24 DIAGNOSIS — R109 Unspecified abdominal pain: Secondary | ICD-10-CM | POA: Insufficient documentation

## 2015-01-24 DIAGNOSIS — X58XXXA Exposure to other specified factors, initial encounter: Secondary | ICD-10-CM | POA: Insufficient documentation

## 2015-01-24 DIAGNOSIS — Y9389 Activity, other specified: Secondary | ICD-10-CM | POA: Insufficient documentation

## 2015-01-24 DIAGNOSIS — Y9289 Other specified places as the place of occurrence of the external cause: Secondary | ICD-10-CM | POA: Diagnosis not present

## 2015-01-24 DIAGNOSIS — S29011A Strain of muscle and tendon of front wall of thorax, initial encounter: Secondary | ICD-10-CM | POA: Insufficient documentation

## 2015-01-24 DIAGNOSIS — S299XXA Unspecified injury of thorax, initial encounter: Secondary | ICD-10-CM | POA: Diagnosis present

## 2015-01-24 DIAGNOSIS — Y998 Other external cause status: Secondary | ICD-10-CM | POA: Diagnosis not present

## 2015-01-24 DIAGNOSIS — T148XXA Other injury of unspecified body region, initial encounter: Secondary | ICD-10-CM

## 2015-01-24 MED ORDER — ACETAMINOPHEN 500 MG PO TABS
1000.0000 mg | ORAL_TABLET | Freq: Once | ORAL | Status: AC
  Administered 2015-01-24: 1000 mg via ORAL
  Filled 2015-01-24: qty 2

## 2015-01-24 MED ORDER — IBUPROFEN 800 MG PO TABS
800.0000 mg | ORAL_TABLET | Freq: Once | ORAL | Status: AC
  Administered 2015-01-24: 800 mg via ORAL
  Filled 2015-01-24: qty 1

## 2015-01-24 NOTE — Discharge Instructions (Signed)
Take 4 over the counter ibuprofen tablets 3 times a day or 2 over-the-counter naproxen tablets twice a day for pain. ° °

## 2015-01-24 NOTE — ED Notes (Signed)
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 Sats are 96% RA, pain began "out of now where and all of sudden while driving. Denies pain in bilateral lower extremiities. Pain is constant, worse with breathing, hiccupping and lying on left side.

## 2015-01-24 NOTE — ED Notes (Signed)
C/o left side pain  Sudden onset 3 days ago,  Non radiating, pain increased w movement, deep breath

## 2015-01-24 NOTE — ED Provider Notes (Signed)
CSN: [id number]     Arrival date & time 01/24/15  2149 History  By signing my name below, I, Gwenyth Ober, attest that this documentation has been prepared under the direction and in the presence of Melene Plan, DO.  Electronically Signed: Gwenyth Ober, ED Scribe. 01/24/2015. 10:11 PM.   Chief Complaint  Patient presents with  . Flank Pain   The history is provided by the patient. No language interpreter was used.   HPI Comments: George Miranda is a 29 y.o. male who presents to the Emergency Department complaining of constant, mild, left-sided inferior, lateral, shooting rib pain that started 2 days ago. Pt reports pain becomes worse with moving, deep inspiration, hiccups and driving. He denies recent injuries or a history of DVT/PE. Pt also denies leg swelling, fever and chills as associated symptoms.   History reviewed. No pertinent past medical history. Past Surgical History  Procedure Laterality Date  . Nasal fracture surgery     Family History  Problem Relation Age of Onset  . Hypertension Mother   . Hyperlipidemia Mother    Social History  Substance Use Topics  . Smoking status: Never Smoker   . Smokeless tobacco: None  . Alcohol Use: No    Review of Systems  Constitutional: Negative for fever and chills.  HENT: Negative for congestion and facial swelling.   Eyes: Negative for discharge and visual disturbance.  Respiratory: Negative for shortness of breath.   Cardiovascular: Negative for chest pain, palpitations and leg swelling.  Gastrointestinal: Negative for vomiting, abdominal pain and diarrhea.  Musculoskeletal: Positive for arthralgias. Negative for myalgias.  Skin: Negative for color change, rash and wound.  Neurological: Negative for tremors, syncope and headaches.  Psychiatric/Behavioral: Negative for confusion and dysphoric mood.  All other systems reviewed and are negative.     Allergies  Review of patient's allergies indicates no known  allergies.  Home Medications   Prior to Admission medications   Medication Sig Start Date End Date Taking? Authorizing Provider  ondansetron (ZOFRAN) 4 mg TABS tablet Take 4 tablets by mouth every 8 (eight) hours as needed. 12/19/14   Samantha Tripp Dowless, PA-C   BP 141/84 mmHg  Pulse 106  Temp(Src) 98.3 F (36.8 C) (Oral)  Resp 18  Wt 335 lb 8 oz (152.182 kg)  SpO2 96% Physical Exam  Constitutional: He is oriented to person, place, and time. He appears well-developed and well-nourished. No distress.  HENT:  Head: Normocephalic and atraumatic.  Eyes: Conjunctivae and EOM are normal. Pupils are equal, round, and reactive to light.  Neck: Normal range of motion. Neck supple. No JVD present. No tracheal deviation present.  Cardiovascular: Normal rate, regular rhythm and normal heart sounds.  Exam reveals no gallop and no friction rub.   No murmur heard. Pulmonary/Chest: Effort normal and breath sounds normal. No respiratory distress. He has no wheezes.  Abdominal: Soft. He exhibits no distension. There is no tenderness. There is no rebound and no guarding.  Musculoskeletal: Normal range of motion. He exhibits tenderness. He exhibits no edema.  Tenderness to costal margin at mid-axillary line, rib 10  Neurological: He is alert and oriented to person, place, and time.  Skin: Skin is warm and dry. No rash noted. No pallor.  Psychiatric: He has a normal mood and affect. His behavior is normal.  Nursing note and vitals reviewed.   ED Course  Procedures  DIAGNOSTIC STUDIES: Oxygen Saturation is 96% on RA, normal by my interpretation.    COORDINATION OF CARE:  10:22 PM Discussed suspicion for muscle strain and treatment plan with pt which includes a chest x-ray. Pt agreed to plan.  Labs Review Labs Reviewed - No data to display  Imaging Review Dg Chest 2 View  01/24/2015  CLINICAL DATA:  29 year old male left chest pain for 3 da. Initial encounter. EXAM: CHEST  2 VIEW COMPARISON:   None. FINDINGS: Large body habitus. Somewhat low lung volumes. Normal cardiac size and mediastinal contours. Visualized tracheal air column is within normal limits. No pneumothorax, pulmonary edema, pleural effusion or confluent pulmonary opacity. Mild to moderate thoracic scoliosis. No acute osseous abnormality identified. IMPRESSION: No acute cardiopulmonary abnormality. Electronically Signed   By: Odessa FlemingH  Hall M.D.   On: 01/24/2015 22:49   I have personally reviewed and evaluated these images as part of my medical decision-making.   EKG Interpretation None      MDM   Final diagnoses:  Muscle strain    29 yo M with a chief complaint of left-sided flank pain. This pain is worse with movement palpation deep breathing. Patient denies colicky pain. Denies hematuria. Denies nausea or vomiting with this. Patient has never had this before. Chest x-ray performed shows no signs of pneumothorax no free air underneath the diaphragm. Feel likely a strained muscle. Will treat with NSAIDs PCP follow-up.  11:59 PM:  I have discussed the diagnosis/risks/treatment options with the patient and believe the pt to be eligible for discharge home to follow-up with PCP. We also discussed returning to the ED immediately if new or worsening sx occur. We discussed the sx which are most concerning (e.g., sudden worsening pain, fever, inability to tolerate by mouth) that necessitate immediate return. Medications administered to the patient during their visit and any new prescriptions provided to the patient are listed below.  Medications given during this visit Medications  acetaminophen (TYLENOL) tablet 1,000 mg (1,000 mg Oral Given 01/24/15 2216)  ibuprofen (ADVIL,MOTRIN) tablet 800 mg (800 mg Oral Given 01/24/15 2216)    Discharge Medication List as of 01/24/2015 11:05 PM      The patient appears reasonably screen and/or stabilized for discharge and I doubt any other medical condition or other [facility] requiring further  screening, evaluation, or treatment in the ED at this time prior to discharge.     I personally performed the services described in this documentation, which was scribed in my presence. The recorded information has been reviewed and is accurate.    Melene Planan Harrison Paulson, DO 01/24/15 2359

## 2015-07-25 ENCOUNTER — Emergency Department (HOSPITAL_BASED_OUTPATIENT_CLINIC_OR_DEPARTMENT_OTHER): Payer: PRIVATE HEALTH INSURANCE

## 2015-07-25 ENCOUNTER — Encounter (HOSPITAL_BASED_OUTPATIENT_CLINIC_OR_DEPARTMENT_OTHER): Payer: Self-pay | Admitting: *Deleted

## 2015-07-25 ENCOUNTER — Emergency Department (HOSPITAL_BASED_OUTPATIENT_CLINIC_OR_DEPARTMENT_OTHER)
Admission: EM | Admit: 2015-07-25 | Discharge: 2015-07-25 | Disposition: A | Discharge status: Home / Self Care | Payer: PRIVATE HEALTH INSURANCE | Attending: Emergency Medicine | Admitting: Emergency Medicine

## 2015-07-25 DIAGNOSIS — J029 Acute pharyngitis, unspecified: Secondary | ICD-10-CM

## 2015-07-25 LAB — BASIC METABOLIC PANEL
ANION GAP: 10 (ref 5–15)
BUN: 22 mg/dL — ABNORMAL HIGH (ref 6–20)
CALCIUM: 9.1 mg/dL (ref 8.9–10.3)
CO2: 27 mmol/L (ref 22–32)
CREATININE: 1.06 mg/dL (ref 0.61–1.24)
Chloride: 99 mmol/L — ABNORMAL LOW (ref 101–111)
GFR calc Af Amer: >60 mL/min (ref >60)
GLUCOSE: 131 mg/dL — AB (ref 65–99)
Potassium: 3.4 mmol/L — ABNORMAL LOW (ref 3.5–5.1)
Sodium: 136 mmol/L (ref 135–145)

## 2015-07-25 LAB — CBC WITH DIFFERENTIAL/PLATELET
BASOS ABS: 0.1 10*3/uL (ref 0.0–0.1)
Basophils Relative: 1 %
EOS PCT: 1 %
Eosinophils Absolute: 0.1 10*3/uL (ref 0.0–0.7)
HCT: 41.6 % (ref 39.0–52.0)
Hemoglobin: 14.6 g/dL (ref 13.0–17.0)
Lymphocytes Relative: 26 %
Lymphs Abs: 2.7 10*3/uL (ref 0.7–4.0)
MCH: 30.4 pg (ref 26.0–34.0)
MCHC: 35.1 g/dL (ref 30.0–36.0)
MCV: 86.5 fL (ref 78.0–100.0)
Monocytes Absolute: 1 10*3/uL (ref 0.1–1.0)
Monocytes Relative: 10 %
NEUTROS PCT: 62 %
Neutro Abs: 6.4 10*3/uL (ref 1.7–7.7)
PLATELETS: 257 10*3/uL (ref 150–400)
RBC: 4.81 MIL/uL (ref 4.22–5.81)
RDW: 13.4 % (ref 11.5–15.5)
WBC: 10.2 10*3/uL (ref 4.0–10.5)

## 2015-07-25 LAB — MONONUCLEOSIS SCREEN: Mono Screen: NEGATIVE

## 2015-07-25 LAB — RAPID STREP SCREEN (MED CTR MEBANE ONLY): STREPTOCOCCUS, GROUP A SCREEN (DIRECT): NEGATIVE

## 2015-07-25 MED ORDER — PANTOPRAZOLE SODIUM 40 MG IV SOLR
40.0000 mg | Freq: Once | INTRAVENOUS | Status: AC
  Administered 2015-07-25: 40 mg via INTRAVENOUS
  Filled 2015-07-25: qty 40

## 2015-07-25 MED ORDER — IOPAMIDOL (ISOVUE-300) INJECTION 61%: 75.0000 mL | Freq: Once | INTRAVENOUS | Status: DC | PRN

## 2015-07-25 MED ORDER — IOPAMIDOL (ISOVUE-300) INJECTION 61%
100.0000 mL | Freq: Once | INTRAVENOUS | Status: AC | PRN
  Administered 2015-07-25: 100 mL via INTRAVENOUS

## 2015-07-25 MED ORDER — HYDROCODONE-ACETAMINOPHEN 5-325 MG PO TABS: 1.0000 | ORAL_TABLET | Freq: Four times a day (QID) | ORAL | Status: DC | PRN

## 2015-07-25 MED ORDER — HYDROCODONE-ACETAMINOPHEN 5-325 MG PO TABS: 1.0000 | ORAL_TABLET | Freq: Once | ORAL | Status: DC

## 2015-07-25 NOTE — ED Provider Notes (Signed)
CSN: [id number]     Arrival date & time 07/25/15  0049 History   First MD Initiated Contact with Patient 07/25/15 0059     Chief Complaint  Patient presents with  . Sore Throat     (Consider location/radiation/quality/duration/timing/severity/associated sxs/prior Treatment) HPI  This is a 30 year old male with a sore throat. He first had a sore throat 2 weeks ago [place]. He was seen in ED and placed on 10 days of amoxicillin and a course of prednisone. He took these as prescribed with improvement. He is now here with a three-day history of another sore throat which is been waxing and waning. He states it is very painful to swallow and difficult to talk. He is also complaining of anterior cervical lymphadenopathy more prominent on the left. He denies fever.  History reviewed. No pertinent past medical history. Past Surgical History  Procedure Laterality Date  . Nasal fracture surgery     Family History  Problem Relation Age of Onset  . Hypertension Mother   . Hyperlipidemia Mother    Social History  Substance Use Topics  . Smoking status: Never Smoker   . Smokeless tobacco: None  . Alcohol Use: No    Review of Systems  All other systems reviewed and are negative.   Allergies  Review of patient's allergies indicates no known allergies.  Home Medications   Prior to Admission medications   Medication Sig Start Date End Date Taking? Authorizing Provider  ondansetron (ZOFRAN) 4 mg TABS tablet Take 4 tablets by mouth every 8 (eight) hours as needed. 12/19/14   Samantha Tripp Dowless, PA-C   BP 143/97 mmHg  Pulse 98  Temp(Src) 98.7 F (37.1 C) (Oral)  Resp 18  Ht 6\' 6"  (1.981 m)  Wt 330 lb (149.687 kg)  BMI 38.14 kg/m2  SpO2 100%   Physical Exam  General: Well-developed, well-nourished male in no acute distress; appearance consistent with age of record HENT: normocephalic; atraumatic; no pharyngeal erythema, exudate or edema; patient reticent  to phonate but does not have dysphonia; no trismus; uvula midline; TMs normal; no submandibular swelling, induration or tenderness Eyes: pupils equal, round and reactive to light; extraocular muscles intact Neck: supple; anterior cervical lymphadenopathy left greater than right Heart: regular rate and rhythm Lungs: clear to auscultation bilaterally Abdomen: soft; nondistended; nontender; no masses or hepatosplenomegaly; bowel sounds present Extremities: No deformity; full range of motion; pulses normal Neurologic: Awake, alert and oriented; motor function intact in all extremities and symmetric; no facial droop Skin: Warm and dry Psychiatric: Normal mood and affect    ED Course  Procedures (including critical care time)   MDM   Nursing notes and vitals signs, including pulse oximetry, reviewed.  Summary of this visit's results, reviewed by myself:  Labs:  Results for orders placed or performed during the hospital encounter of 07/25/15 (from the past 24 hour(s))  Rapid strep screen     Status: None   Collection Time: 07/25/15  1:09 AM  Result Value Ref Range   Streptococcus, Group A Screen (Direct) NEGATIVE NEGATIVE  Mononucleosis screen     Status: None   Collection Time: 07/25/15  1:15 AM  Result Value Ref Range   Mono Screen NEGATIVE NEGATIVE  CBC with Differential/Platelet     Status: None   Collection Time: 07/25/15  1:15 AM  Result Value Ref Range   WBC 10.2 4.0 - 10.5 K/uL   RBC 4.81 4.22 - 5.81 MIL/uL   Hemoglobin 14.6  13.0 - 17.0 g/dL   HCT 16.1 09.6 - 04.5 %   MCV 86.5 78.0 - 100.0 fL   MCH 30.4 26.0 - 34.0 pg   MCHC 35.1 30.0 - 36.0 g/dL   RDW 40.9 81.1 - 91.4 %   Platelets 257 150 - 400 K/uL   Neutrophils Relative % 62 %   Neutro Abs 6.4 1.7 - 7.7 K/uL   Lymphocytes Relative 26 %   Lymphs Abs 2.7 0.7 - 4.0 K/uL   Monocytes Relative 10 %   Monocytes Absolute 1.0 0.1 - 1.0 K/uL   Eosinophils Relative 1 %   Eosinophils Absolute 0.1 0.0 - 0.7 K/uL    Basophils Relative 1 %   Basophils Absolute 0.1 0.0 - 0.1 K/uL  Basic metabolic panel     Status: Abnormal   Collection Time: 07/25/15  1:15 AM  Result Value Ref Range   Sodium 136 135 - 145 mmol/L   Potassium 3.4 (L) 3.5 - 5.1 mmol/L   Chloride 99 (L) 101 - 111 mmol/L   CO2 27 22 - 32 mmol/L   Glucose, Bld 131 (H) 65 - 99 mg/dL   BUN 22 (H) 6 - 20 mg/dL   Creatinine, Ser 7.82 0.61 - 1.24 mg/dL   Calcium 9.1 8.9 - 95.6 mg/dL   GFR calc non Af Amer >60 >60 mL/min   GFR calc Af Amer >60 >60 mL/min   Anion gap 10 5 - 15    Imaging Studies: Ct Soft Tissue Neck W Contrast  07/25/2015  CLINICAL DATA:  LEFT neck pain tonight with sore throat. Difficulty speaking intermittently for 2 weeks, completed antibiotics and steroids. EXAM: CT NECK WITH CONTRAST TECHNIQUE: Multidetector CT imaging of the neck was performed using the standard protocol following the bolus administration of intravenous contrast. CONTRAST:  100 cc Isovue 300 COMPARISON:  None. FINDINGS: Pharynx and larynx: Normal appearance of the pharynx and larynx. Airway is widely patent. Salivary glands: Normal. Thyroid: Normal. Lymph nodes: Greater than expected number of top-normal size reniform lymph nodes are likely reactive. Vascular: Normal. Limited intracranial: Normal. Visualized orbits: Old LEFT orbital blowout fracture, external herniation of extraconal fat without entrapment of the extra-ocular muscles. Mastoids and visualized paranasal sinuses: Small bilateral maxillary mucosal retention cyst without paranasal sinus air-fluid levels. Small bilateral mastoid effusions. Skeleton: Straightened cervical lordosis with broad levoscoliosis. No destructive bony lesions. Upper chest: Lung apices are clear. No superior mediastinal lymphadenopathy. IMPRESSION: No acute process in the neck, widely patent airway. Borderline lymphadenopathy is likely reactive. Electronically Signed   By: Awilda Metro M.D.   On: 07/25/2015 03:19   3:50  AM Patient advised of reassuring CT and lab findings. It is likely he has contracted add another viral pharyngitis. He was advised to start taking over-the-counter Prilosec or Prevacid as there may be an element of GERD involved.  Paula Libra, MD 07/25/15 [phone]

## 2015-07-25 NOTE — ED Notes (Signed)
MD at bedside. 

## 2015-07-25 NOTE — ED Notes (Addendum)
Pt c/o sore throat x 3 days Hx of same 2 weeks ago completes amoxil and prednisone

## 2015-07-25 NOTE — Discharge Instructions (Signed)

## 2015-07-27 LAB — CULTURE, GROUP A STREP (THRC)

## 2015-07-29 ENCOUNTER — Emergency Department (HOSPITAL_BASED_OUTPATIENT_CLINIC_OR_DEPARTMENT_OTHER)
Admission: EM | Admit: 2015-07-29 | Discharge: 2015-07-29 | Disposition: A | Discharge status: Home / Self Care | Payer: PRIVATE HEALTH INSURANCE | Attending: Emergency Medicine | Admitting: Emergency Medicine

## 2015-07-29 ENCOUNTER — Encounter (HOSPITAL_BASED_OUTPATIENT_CLINIC_OR_DEPARTMENT_OTHER): Payer: Self-pay

## 2015-07-29 DIAGNOSIS — B370 Candidal stomatitis: Secondary | ICD-10-CM

## 2015-07-29 DIAGNOSIS — Z79899 Other long term (current) drug therapy: Secondary | ICD-10-CM | POA: Insufficient documentation

## 2015-07-29 DIAGNOSIS — B379 Candidiasis, unspecified: Secondary | ICD-10-CM | POA: Insufficient documentation

## 2015-07-29 LAB — RAPID STREP SCREEN (MED CTR MEBANE ONLY): Streptococcus, Group A Screen (Direct): NEGATIVE

## 2015-07-29 MED ORDER — NYSTATIN 100000 UNIT/ML MT SUSP: 5.0000 mL | Freq: Four times a day (QID) | OROMUCOSAL | Status: DC

## 2015-07-29 MED ORDER — NYSTATIN 100000 UNIT/ML MT SUSP: 500000.0000 [IU] | Freq: Four times a day (QID) | OROMUCOSAL | Status: DC

## 2015-07-29 NOTE — ED Provider Notes (Signed)
CSN: [id number]     Arrival date & time 07/29/15  1917 History   First MD Initiated Contact with Patient 07/29/15 2002     Chief Complaint  Patient presents with  . Sore Throat     (Consider location/radiation/quality/duration/timing/severity/associated sxs/prior Treatment) Patient is a 30 y.o. male presenting with pharyngitis. The history is provided by the patient and medical records. No language interpreter was used.  Sore Throat Associated symptoms include a sore throat. Pertinent negatives include no chest pain, congestion, coughing or fever.   George Miranda is a 30 y.o. male  With no pertinent PMH who presents to the Emergency Department complaining of sore throat. Initially seen for sore throat on April 1 where he was given amoxicillin and prednisone for his sore throat. Patient had noticeable improvement of symptoms, however after he finished these medications sore throat returned. He was seen in the ED on 4/13 where a CT soft tissue was done which was unremarkable. Diagnosed with viral pharyngitis. Yesterday, he noticed white plaque in the back of his throat and on his tongue. Patient states plaque is partially removable with his toothbrush. No alleviating or aggravating factors noted. Patient denies associated symptoms including fever, congestion, chest pain, shortness breath.  History reviewed. No pertinent past medical history. Past Surgical History  Procedure Laterality Date  . Nasal fracture surgery     Family History  Problem Relation Age of Onset  . Hypertension Mother   . Hyperlipidemia Mother    Social History  Substance Use Topics  . Smoking status: Never Smoker   . Smokeless tobacco: None  . Alcohol Use: No    Review of Systems  Constitutional: Negative for fever.  HENT: Positive for sore throat. Negative for congestion.   Respiratory: Negative for cough and shortness of breath.   Cardiovascular: Negative for chest pain.      Allergies  Review of  patient's allergies indicates no known allergies.  Home Medications   Prior to Admission medications   Medication Sig Start Date End Date Taking? Authorizing Provider  cetirizine (ZYRTEC) 10 MG tablet Take 10 mg by mouth daily.   Yes Historical Provider, MD  HYDROcodone-acetaminophen (NORCO) 5-325 MG tablet Take 1 tablet by mouth every 6 (six) hours as needed (for pain). 07/25/15   John Molpus, MD  nystatin (MYCOSTATIN) 100000 UNIT/ML suspension Use as directed 5 mLs (500,000 Units total) in the mouth or throat 4 (four) times daily. Swish in mouth and gargle, then spit out. 07/29/15   Chase Picket Ward, PA-C  ondansetron (ZOFRAN) 4 mg TABS tablet Take 4 tablets by mouth every 8 (eight) hours as needed. 12/19/14   Samantha Tripp Dowless, PA-C   BP 144/103 mmHg  Pulse 105  Temp(Src) 99.6 F (37.6 C) (Oral)  Resp 18  Ht  (1.981 m)  Wt 149.687 kg  BMI 38.14 kg/m2  SpO2 98% Physical Exam  Constitutional: He is oriented to person, place, and time. He appears well-developed and well-nourished. No distress.  HENT:  Head: Normocephalic and atraumatic.  Tongue with white plaque which was able to be scrapped off with tongue depressor. OP with erythema and tonsillar hypertrophy.   Neck: Normal range of motion. Neck supple.  No meningeal signs.   Cardiovascular: Normal rate, regular rhythm and normal heart sounds.   Pulmonary/Chest: Effort normal.  Lungs are clear to auscultation bilaterally - no w/r/r  Abdominal: Soft. He exhibits no distension. There is no tenderness.  Musculoskeletal: Normal range of motion.  Neurological: He is alert  and oriented to person, place, and time.  Skin: Skin is warm and dry. He is not diaphoretic.  Nursing note and vitals reviewed.   ED Course  Procedures (including critical care time) Labs Review Labs Reviewed  RAPID STREP SCREEN (NOT AT [facility])  CULTURE, GROUP A STREP [facility])    Imaging Review No results found. I have personally reviewed and  evaluated these images and lab results as part of my medical decision-making.   EKG Interpretation None      MDM   Final diagnoses:  Norm Salthrush   George Miranda presents to ED for sore throat and white plaque noticed on tongue. Seen on 4/13 for sore throat where strep was negative and CT soft tissue neck was unremarkable. Rapid strep negative. On exam, patient with plaque on tongue c/w thrush. He has recently been on antibiotics and steroids. Will treat with nystatin swish and spit. Return precautions and follow up care discussed. All questions answered.    [facility] NorthJaime Pilcher Ward, PA-C 07/29/15 2029  Jerelyn ScottMartha Linker, MD 07/29/15 2037

## 2015-07-29 NOTE — Discharge Instructions (Signed)
Used nystatin as directed. Follow-up with your primary physician in regards to today's visit. Return to ER for any new or worsening symptoms, any additional concerns.

## 2015-07-29 NOTE — ED Notes (Signed)
Pt reports sore throat, states he also feels he has thrush.  Small amount of white coating to R side of tongue, slight tonsillar swelling and redness.

## 2015-08-01 LAB — CULTURE, GROUP A STREP (THRC)

## 2016-12-10 ENCOUNTER — Encounter (HOSPITAL_BASED_OUTPATIENT_CLINIC_OR_DEPARTMENT_OTHER): Payer: Self-pay

## 2016-12-10 ENCOUNTER — Emergency Department (HOSPITAL_BASED_OUTPATIENT_CLINIC_OR_DEPARTMENT_OTHER)
Admission: EM | Admit: 2016-12-10 | Discharge: 2016-12-10 | Disposition: A | Discharge status: Home / Self Care | Payer: PRIVATE HEALTH INSURANCE | Attending: Emergency Medicine | Admitting: Emergency Medicine

## 2016-12-10 DIAGNOSIS — R197 Diarrhea, unspecified: Secondary | ICD-10-CM | POA: Insufficient documentation

## 2016-12-10 DIAGNOSIS — R112 Nausea with vomiting, unspecified: Secondary | ICD-10-CM | POA: Insufficient documentation

## 2016-12-10 DIAGNOSIS — Z79899 Other long term (current) drug therapy: Secondary | ICD-10-CM | POA: Insufficient documentation

## 2016-12-10 LAB — BASIC METABOLIC PANEL
ANION GAP: 7 (ref 5–15)
BUN: 17 mg/dL (ref 6–20)
CALCIUM: 8.5 mg/dL — AB (ref 8.9–10.3)
CO2: 25 mmol/L (ref 22–32)
Chloride: 105 mmol/L (ref 101–111)
Creatinine, Ser: 0.77 mg/dL (ref 0.61–1.24)
GFR calc non Af Amer: >60 mL/min (ref >60)
GLUCOSE: 147 mg/dL — AB (ref 65–99)
Potassium: 3.2 mmol/L — ABNORMAL LOW (ref 3.5–5.1)
Sodium: 137 mmol/L (ref 135–145)

## 2016-12-10 LAB — CBC WITH DIFFERENTIAL/PLATELET
BASOS ABS: 0.1 10*3/uL (ref 0.0–0.1)
BASOS PCT: 1 %
Eosinophils Absolute: 0.1 10*3/uL (ref 0.0–0.7)
Eosinophils Relative: 2 %
HEMATOCRIT: 40.4 % (ref 39.0–52.0)
HEMOGLOBIN: 13.8 g/dL (ref 13.0–17.0)
LYMPHS PCT: 34 %
Lymphs Abs: 2.3 10*3/uL (ref 0.7–4.0)
MCH: 29.1 pg (ref 26.0–34.0)
MCHC: 34.2 g/dL (ref 30.0–36.0)
MCV: 85.1 fL (ref 78.0–100.0)
Monocytes Absolute: 0.6 10*3/uL (ref 0.1–1.0)
Monocytes Relative: 8 %
NEUTROS ABS: 3.8 10*3/uL (ref 1.7–7.7)
Neutrophils Relative %: 55 %
Platelets: 239 10*3/uL (ref 150–400)
RBC: 4.75 MIL/uL (ref 4.22–5.81)
RDW: 13.4 % (ref 11.5–15.5)
WBC: 6.8 10*3/uL (ref 4.0–10.5)

## 2016-12-10 MED ORDER — FENTANYL CITRATE (PF) 100 MCG/2ML IJ SOLN: 25.0000 ug | Freq: Once | INTRAMUSCULAR | Status: DC

## 2016-12-10 MED ORDER — ONDANSETRON 8 MG PO TBDP: 8.0000 mg | ORAL_TABLET | Freq: Three times a day (TID) | ORAL | 0 refills | Status: AC | PRN

## 2016-12-10 MED ORDER — ONDANSETRON HCL 4 MG/2ML IJ SOLN
4.0000 mg | Freq: Once | INTRAMUSCULAR | Status: AC
  Administered 2016-12-10: 4 mg via INTRAVENOUS
  Filled 2016-12-10: qty 2

## 2016-12-10 MED ORDER — SODIUM CHLORIDE 0.9 % IV BOLUS (SEPSIS)
1000.0000 mL | Freq: Once | INTRAVENOUS | Status: AC
  Administered 2016-12-10: 1000 mL via INTRAVENOUS

## 2016-12-10 NOTE — ED Notes (Signed)
Pt tolerated PO fluids, verbalizes understanding of dc instructions and denies any further needs at this time.

## 2016-12-10 NOTE — ED Triage Notes (Signed)
Pt began vomiting at 0400 today, 4 episodes with 1 episode of diarrhea and lower abdominal pain, no sick contacts, no fevers

## 2016-12-10 NOTE — Discharge Instructions (Signed)

## 2016-12-10 NOTE — ED Provider Notes (Signed)
[facility] Provider Note   CSN: [id number] Arrival date & time: 12/10/16  0550     History   Chief Complaint Chief Complaint  Patient presents with  . Emesis    HPI George Miranda is a 31 y.o. male.  The history is provided by the patient.  Emesis   This is a new problem. The current episode started 1 to 2 hours ago. The problem occurs 2 to 4 times per day. The problem has not changed since onset.There has been no fever. Associated symptoms include abdominal pain, diarrhea and sweats. Pertinent negatives include no fever.  pt reports he was [place] ([place] and began having up to 4 episodes of vomiting (nonbloody) He began driving to the ER and had to pull car over and had a bowel movement on the side of the road.  He reports he had nonbloody diarrhea He has lower abdominal pain He had otherwise been well prior to this episode No new toxic exposures at work No sick contacts    PMH -none Soc hx - no recent surgery Past Surgical History:  Procedure Laterality Date  . NASAL FRACTURE SURGERY         Home Medications    Prior to Admission medications   Medication Sig Start Date End Date Taking? Authorizing Provider  cetirizine (ZYRTEC) 10 MG tablet Take 10 mg by mouth daily.    [provider]    Family History Family History  Problem Relation Age of Onset  . Hypertension Mother   . Hyperlipidemia Mother     Social History Social History  Substance Use Topics  . Smoking status: Never Smoker  . Smokeless tobacco: Not on file  . Alcohol use No     Allergies   Patient has no known allergies.   Review of Systems Review of Systems  Constitutional: Positive for diaphoresis. Negative for fever.  Gastrointestinal: Positive for abdominal pain, diarrhea and vomiting. Negative for blood in stool.  All other systems reviewed and are negative.    Physical Exam Updated Vital Signs BP 138/82 (BP Location: Right Arm)    Pulse 80   Temp 98.6 F (37 C) (Oral)   Resp 20   Ht 1.981 m (6\' 6" )   Wt (!) 145.2 kg (320 lb)   SpO2 97%   BMI 36.98 kg/m   Physical Exam CONSTITUTIONAL: Well developed/well nourished HEAD: Normocephalic/atraumatic EYES: EOMI/PERRL, no icterus ENMT: Mucous membranes dry NECK: supple no meningeal signs SPINE/BACK:entire spine nontender CV: S1/S2 noted, no murmurs/rubs/gallops noted LUNGS: Lungs are clear to auscultation bilaterally, no apparent distress ABDOMEN: soft, diffuse moderate tenderness, no rebound or guarding, bowel sounds noted throughout abdomen GU:no cva tenderness NEURO: Pt is awake/alert/appropriate, moves all extremitiesx4.  No facial droop.   EXTREMITIES:  full ROM SKIN: warm, color normal PSYCH: no abnormalities of mood noted, alert and oriented to situation   ED Treatments / Results  Labs (all labs ordered are listed, but only abnormal results are displayed) Labs Reviewed  BASIC METABOLIC PANEL - Abnormal; Notable for the following:       Result Value   Potassium 3.2 (*)    Glucose, Bld 147 (*)    Calcium 8.5 (*)    All other components within normal limits  CBC WITH DIFFERENTIAL/PLATELET    EKG  EKG Interpretation None       Radiology No results found.  Procedures Procedures (including critical care time)  Medications Ordered in ED Medications  sodium chloride  0.9 % bolus 1,000 mL (0 mLs Intravenous Stopped 12/10/16 0659)  ondansetron (ZOFRAN) injection 4 mg (4 mg Intravenous Given 12/10/16 0621)     Initial Impression / Assessment and Plan / ED Course  I have reviewed the triage vital signs and the nursing notes.  Pertinent labs   results that were available during my care of the patient were reviewed by me and considered in my medical decision making (see chart for details).     Pt well appearing No distress He is improved without pain meds He has improved with IV fluids He is taking PO Will d/c home We discussed strict ER  return precautions   Final Clinical Impressions(s) / ED Diagnoses   Final diagnoses:  Nausea vomiting and diarrhea    New Prescriptions New Prescriptions   ONDANSETRON (ZOFRAN ODT) 8 MG DISINTEGRATING TABLET    Take 1 tablet (8 mg total) by mouth every 8 (eight) hours as needed.     Zadie Rhine, MD 12/10/16 [phone]

## 2017-11-02 IMAGING — CT CT NECK W/ CM
4 of 5 series · 16 of 33 positions shown, 19 images · IV contrast (isovue)
Comparison: None.

CLINICAL DATA: LEFT neck pain tonight with sore throat. Difficulty
speaking intermittently for 2 weeks, completed antibiotics and
steroids.

EXAM:
CT NECK WITH CONTRAST
TECHNIQUE: Multidetector CT imaging of the neck was performed using the
standard protocol following the bolus administration of intravenous
contrast.
CONTRAST:  100 cc Isovue 300

[Series 3: axial neck · axial · 0.51mm/px · z∈[-247,-101]mm · 3 of 147 slices shown]
[im 37/147  bone]
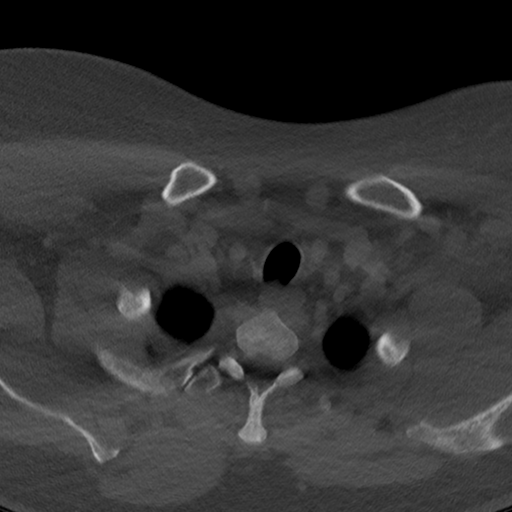
[im 74/147  bone]
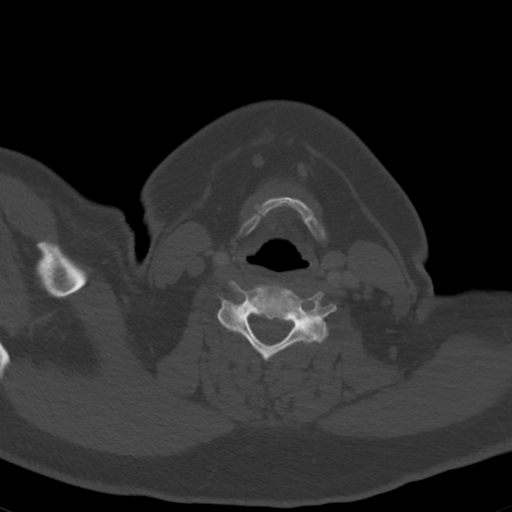
[im 110/147  bone]
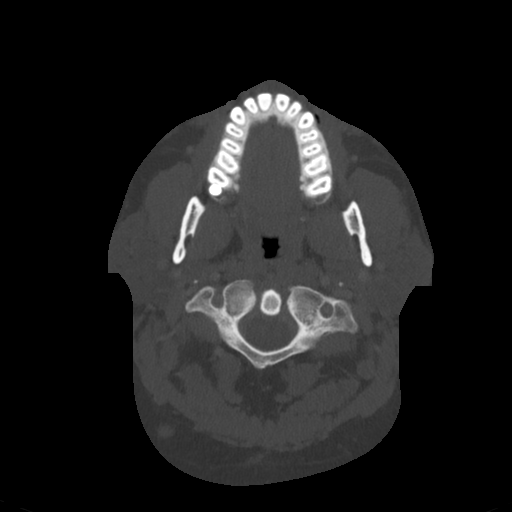

[Series 7: sag neck · sagittal · 0.60mm/px · 5 of 178 slices shown, 6 images]
[im 60/178  bone]
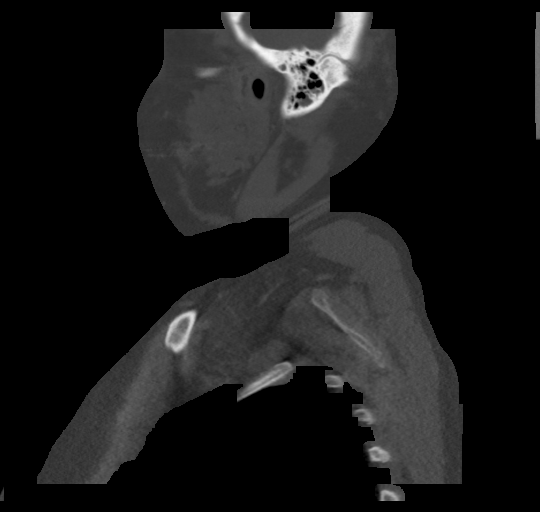
[im 74/178  bone]
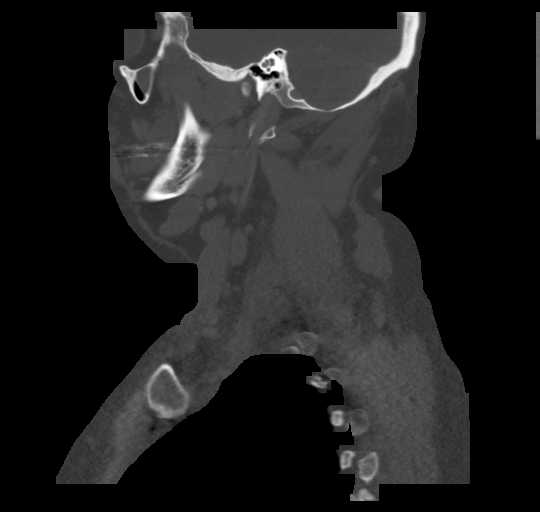
[im 89/178  soft-tissue]
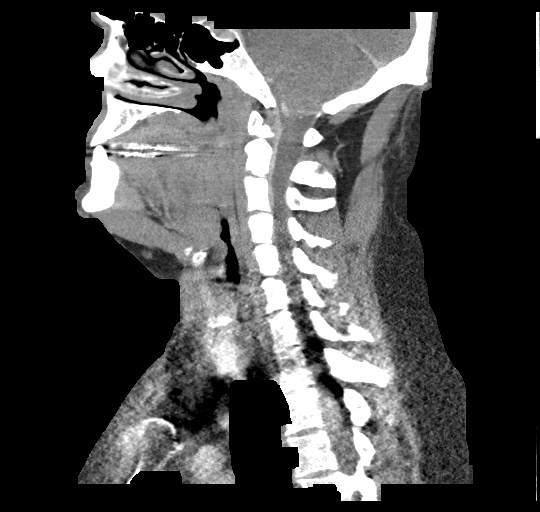
[im 89/178  bone]
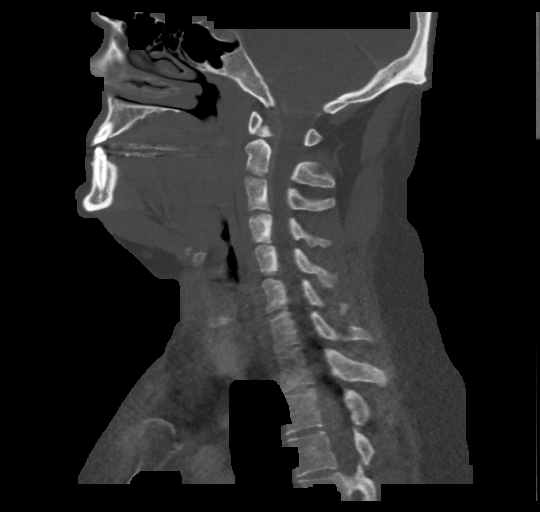
[im 104/178  bone]
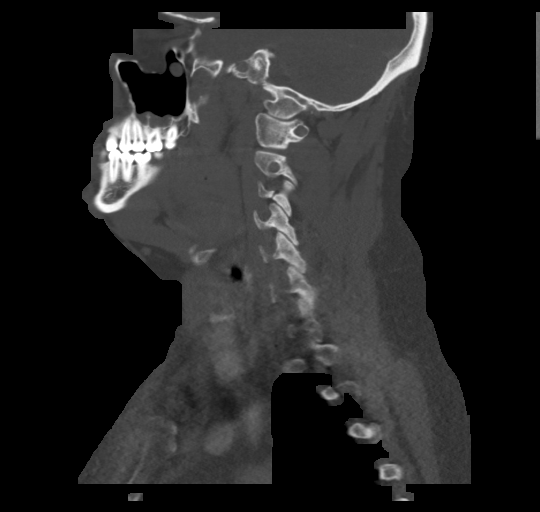
[im 119/178  bone]
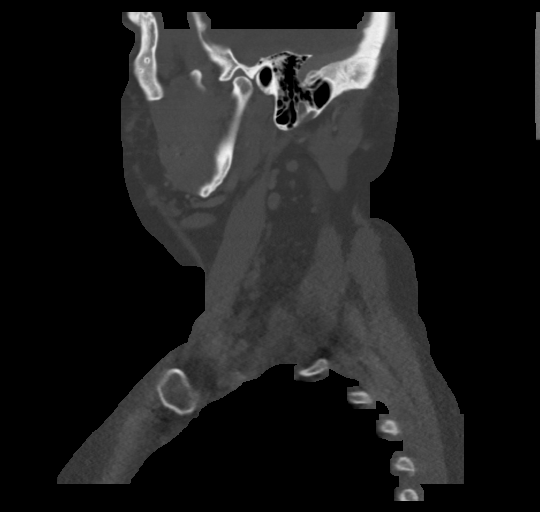

[Series 8: cor neck · coronal · 0.63mm/px · 3 of 112 slices shown]
[im 29/112  bone]
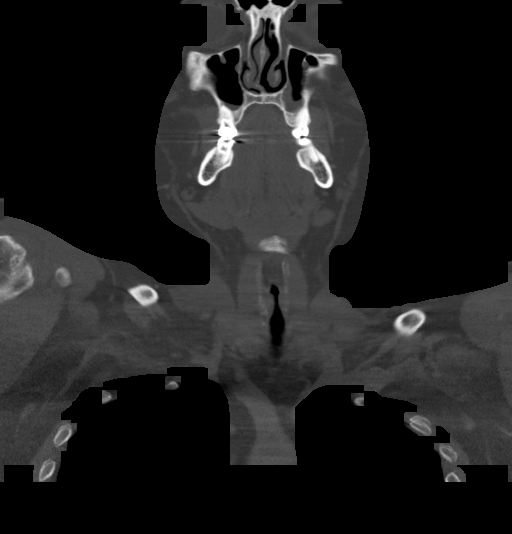
[im 47/112  bone]
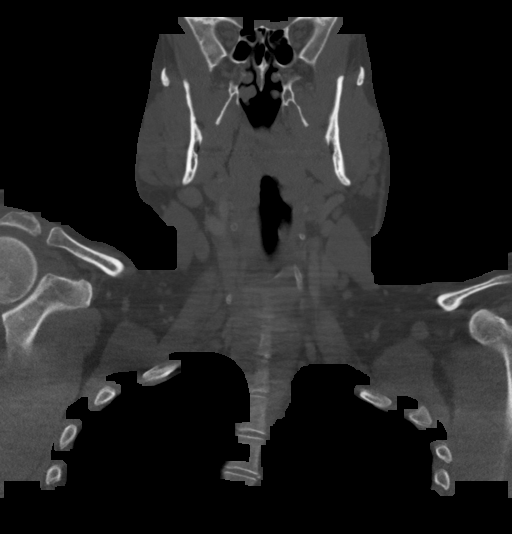
[im 65/112  bone]
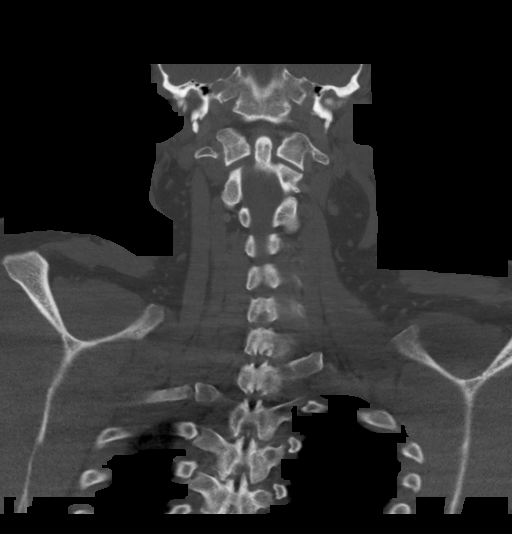

[Series 9: orthogonal ax · axial · 0.50mm/px · z∈[-309,-91]mm · 5 of 171 slices shown, 7 images]
[im 29/171  soft-tissue]
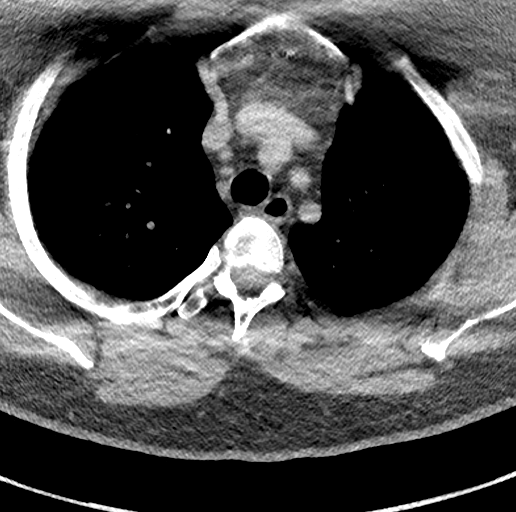
[im 29/171  bone]
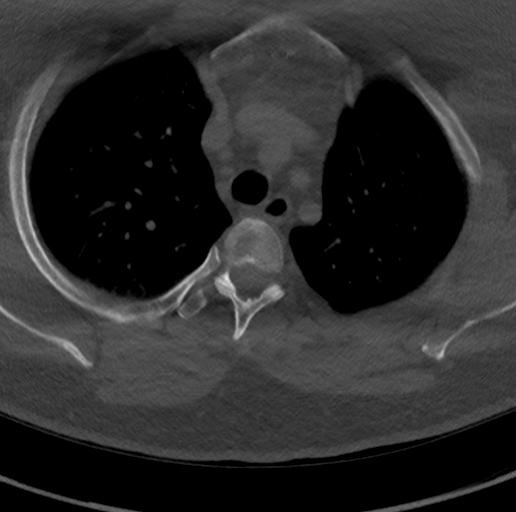
[im 57/171  bone]
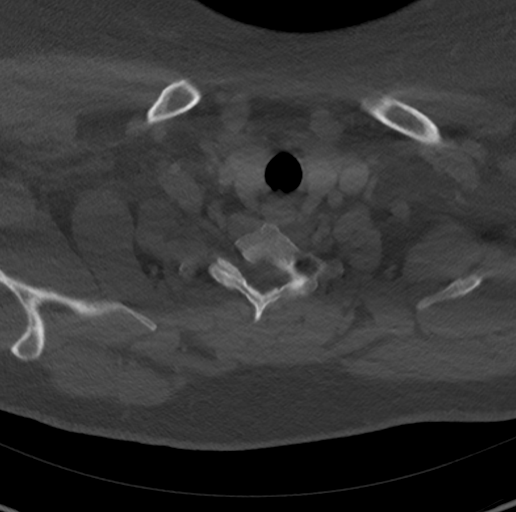
[im 86/171  bone]
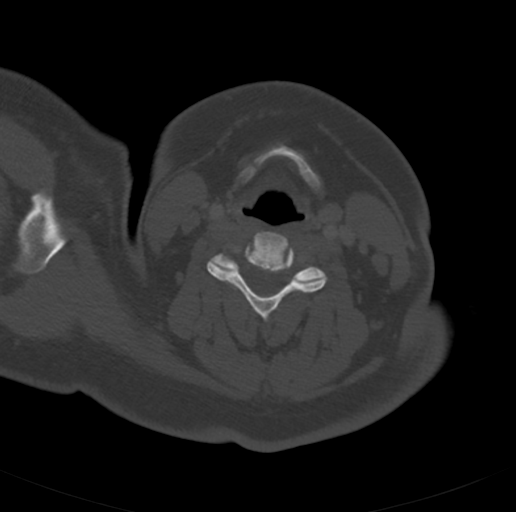
[im 114/171  bone]
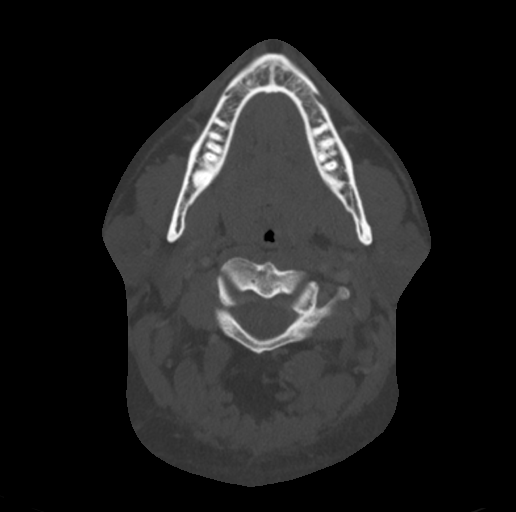
[im 142/171  soft-tissue]
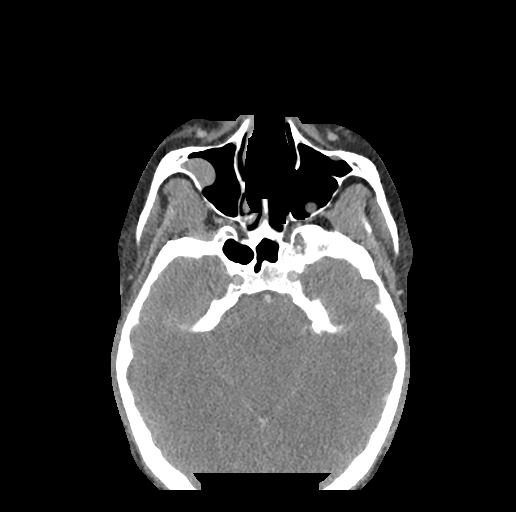
[im 142/171  bone]
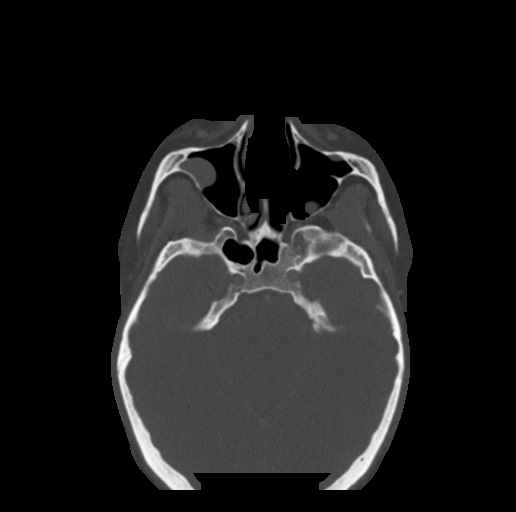

[16 of 33 positions shown; findings below may reference images not displayed]

FINDINGS: Pharynx and larynx: Normal appearance of the pharynx and larynx.
Airway is widely patent.

Salivary glands: Normal.

Thyroid: Normal.

Lymph nodes: Greater than expected number of top-normal size
reniform lymph nodes are likely reactive.

Vascular: Normal.

Limited intracranial: Normal.

Visualized orbits: Old LEFT orbital blowout fracture, external
herniation of extraconal fat without entrapment of the extra-ocular
muscles.

Mastoids and visualized paranasal sinuses: Small bilateral maxillary
mucosal retention cyst without paranasal sinus air-fluid levels.
Small bilateral mastoid effusions.

Skeleton: Straightened cervical lordosis with broad levoscoliosis.
No destructive bony lesions.

Upper chest: Lung apices are clear. No superior mediastinal
lymphadenopathy.
IMPRESSION: No acute process in the neck, widely patent airway.

Borderline lymphadenopathy is likely reactive.
